# Patient Record
Sex: Female | Born: 1965 | Race: White | Hispanic: No | Smoking: Never smoker
Health system: Southern US, Community
[De-identification: ages and names within clinical notes are randomized; demographics above are authoritative.]

## PROBLEM LIST (undated history)

## (undated) DIAGNOSIS — F419 Anxiety disorder, unspecified: Secondary | ICD-10-CM

## (undated) DIAGNOSIS — G4730 Sleep apnea, unspecified: Secondary | ICD-10-CM

## (undated) HISTORY — PX: MENISCUS REPAIR: SHX5179

---

## 2007-02-08 ENCOUNTER — Ambulatory Visit: Payer: Self-pay | Admitting: Cardiology

## 2007-04-02 ENCOUNTER — Other Ambulatory Visit: Admission: RE | Admit: 2007-04-02 | Discharge: 2007-04-02 | Payer: Self-pay | Admitting: Gynecology

## 2008-04-02 ENCOUNTER — Other Ambulatory Visit: Admission: RE | Admit: 2008-04-02 | Discharge: 2008-04-02 | Payer: Self-pay | Admitting: Gynecology

## 2010-08-17 NOTE — Assessment & Plan Note (Signed)
[facility]                            CARDIOLOGY OFFICE NOTE   [name], [name]                          MRN:          [id number]  DATE:02/08/2007                            DOB:          [birth year]    I was asked by Payton Spark to evaluate Kathryn Reese, a  delightful 45 year old married white female with chest discomfort.   This happened on Monday evening a couple of days ago while she was  sitting on the couch watching a movie. She felt a squeezing sensation.  She has been a little short of breath with exertion over the last 2 to 3  days.   She does not have chest squeezing with walking. She has been less active  as of late and has gained some weight. She is under a lot of stress in  general.   She denies any fever, chills, cough, or hemoptysis. She has had no  difficulty swallowing or reflux symptoms. She denies any melena.   PAST MEDICAL HISTORY:  She does not smoke or drink. There is no history  of hypertension, diabetes, or recreational drug use. She does not have  hyperlipidemia. There is no premature history of coronary disease in her  family.   She is not allergic to dye.   She is currently on:  1. Cyclessa birth control pill.  2. Celexa 20 mg daily.   PAST SURGICAL HISTORY:  Negative.   FAMILY HISTORY:  Negative for premature coronary disease.   SOCIAL HISTORY:  She is married and has no children. 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   REVIEW OF SYSTEMS:  Negative except for the HPI, and she does have a  history of chronic anxiety.   PHYSICAL EXAMINATION:  VITAL SIGNS:  Blood pressure 112/62, pulse 80 and  regular. Her EKG is normal. She is 5 foot 4 inches and weighs 208  pounds. Respiratory rate is 18 and unlabored.  GENERAL:  No acute distress.  SKIN:  Warm and dry.  HEENT:  Normocephalic atraumatic, PERRLA, extraocular movements intact,  sclerae clear, facial symmetry is normal.  NECK:   Supple. Carotid upstrokes are equal bilaterally without bruits.  There is no JVD. Thyroid is not enlarged. Trachea is midline.  LUNGS:  Clear. No rhonchi, wheezes, or rubs.  CARDIAC:  PMI was difficult to appreciate. She has normal S1, S2, with  no murmurs, rubs, or gallops.  ABDOMEN:  Soft with good bowel sounds.  EXTREMITIES:  No cyanosis, clubbing, or edema. Pulses are intact.  NEUROLOGIC:  Intact.   ASSESSMENT AND PLAN:  1. Non-cardiac chest pain.  2. Probable anxiety related chest discomfort and shortness of breath.  3. Deconditioning.  4. Obesity.   I have had a long talk with [name] today. Her Framingham risk score  is less than 1% of having a coronary event in the next ten years. I have  advised her that if she will walk or exercise three hours plus a week,  she will lower her risk about 20% to 25% per year of having  a vascular  event. She is determined to do so.   We will see her back on a p.r.n. basis. I called Payton Spark  about this patient.     Thomas C. Daleen Squibb, MD, [facility]  Electronically Signed    TCW/MedQ  DD: 02/08/2007  DT: 02/09/2007  Job #: [id number]   cc:   Payton Spark, [facility]

## 2014-05-23 ENCOUNTER — Other Ambulatory Visit: Payer: Self-pay | Admitting: Gynecology

## 2014-05-23 DIAGNOSIS — R928 Other abnormal and inconclusive findings on diagnostic imaging of breast: Secondary | ICD-10-CM

## 2014-06-03 ENCOUNTER — Ambulatory Visit
Admission: RE | Admit: 2014-06-03 | Discharge: 2014-06-03 | Disposition: A | Discharge status: Home / Self Care | Payer: Managed Care, Other (non HMO) | Source: Ambulatory Visit | Attending: Gynecology | Admitting: Gynecology

## 2014-06-03 ENCOUNTER — Other Ambulatory Visit: Payer: Self-pay | Admitting: Gynecology

## 2014-06-03 DIAGNOSIS — R928 Other abnormal and inconclusive findings on diagnostic imaging of breast: Secondary | ICD-10-CM

## 2016-09-13 ENCOUNTER — Other Ambulatory Visit: Payer: Self-pay | Admitting: Obstetrics and Gynecology

## 2016-09-13 DIAGNOSIS — R928 Other abnormal and inconclusive findings on diagnostic imaging of breast: Secondary | ICD-10-CM

## 2016-09-21 ENCOUNTER — Other Ambulatory Visit: Payer: Self-pay | Admitting: Obstetrics and Gynecology

## 2016-09-21 ENCOUNTER — Ambulatory Visit
Admission: RE | Admit: 2016-09-21 | Discharge: 2016-09-21 | Disposition: A | Discharge status: Home / Self Care | Payer: Managed Care, Other (non HMO) | Source: Ambulatory Visit | Attending: Obstetrics and Gynecology | Admitting: Obstetrics and Gynecology

## 2016-09-21 DIAGNOSIS — R921 Mammographic calcification found on diagnostic imaging of breast: Secondary | ICD-10-CM

## 2016-09-21 DIAGNOSIS — R928 Other abnormal and inconclusive findings on diagnostic imaging of breast: Secondary | ICD-10-CM

## 2017-03-29 ENCOUNTER — Other Ambulatory Visit: Payer: Self-pay | Admitting: Obstetrics and Gynecology

## 2017-03-29 ENCOUNTER — Ambulatory Visit
Admission: RE | Admit: 2017-03-29 | Discharge: 2017-03-29 | Disposition: A | Discharge status: Home / Self Care | Payer: Managed Care, Other (non HMO) | Source: Ambulatory Visit | Attending: Obstetrics and Gynecology | Admitting: Obstetrics and Gynecology

## 2017-03-29 ENCOUNTER — Other Ambulatory Visit: Payer: Managed Care, Other (non HMO)

## 2017-03-29 DIAGNOSIS — R921 Mammographic calcification found on diagnostic imaging of breast: Secondary | ICD-10-CM

## 2017-09-28 ENCOUNTER — Ambulatory Visit
Admission: RE | Admit: 2017-09-28 | Discharge: 2017-09-28 | Disposition: A | Discharge status: Home / Self Care | Payer: Managed Care, Other (non HMO) | Source: Ambulatory Visit | Attending: Obstetrics and Gynecology | Admitting: Obstetrics and Gynecology

## 2017-09-28 DIAGNOSIS — R921 Mammographic calcification found on diagnostic imaging of breast: Secondary | ICD-10-CM

## 2018-09-19 ENCOUNTER — Other Ambulatory Visit: Payer: Self-pay | Admitting: Obstetrics and Gynecology

## 2018-09-19 DIAGNOSIS — R921 Mammographic calcification found on diagnostic imaging of breast: Secondary | ICD-10-CM

## 2018-09-20 ENCOUNTER — Other Ambulatory Visit: Payer: Self-pay | Admitting: Specialist

## 2018-10-19 ENCOUNTER — Ambulatory Visit
Admission: RE | Admit: 2018-10-19 | Discharge: 2018-10-19 | Disposition: A | Discharge status: Home / Self Care | Payer: Managed Care, Other (non HMO) | Source: Ambulatory Visit | Attending: Obstetrics and Gynecology | Admitting: Obstetrics and Gynecology

## 2018-10-19 ENCOUNTER — Other Ambulatory Visit: Payer: Self-pay | Admitting: Obstetrics and Gynecology

## 2018-10-19 ENCOUNTER — Other Ambulatory Visit: Payer: Self-pay

## 2018-10-19 DIAGNOSIS — R921 Mammographic calcification found on diagnostic imaging of breast: Secondary | ICD-10-CM

## 2018-10-23 ENCOUNTER — Ambulatory Visit
Admission: RE | Admit: 2018-10-23 | Discharge: 2018-10-23 | Disposition: A | Discharge status: Home / Self Care | Payer: Managed Care, Other (non HMO) | Source: Ambulatory Visit | Attending: Obstetrics and Gynecology | Admitting: Obstetrics and Gynecology

## 2018-10-23 ENCOUNTER — Other Ambulatory Visit: Payer: Self-pay

## 2018-10-23 DIAGNOSIS — R921 Mammographic calcification found on diagnostic imaging of breast: Secondary | ICD-10-CM

## 2018-11-02 ENCOUNTER — Other Ambulatory Visit: Payer: Self-pay | Admitting: Surgery

## 2018-11-02 DIAGNOSIS — N6091 Unspecified benign mammary dysplasia of right breast: Secondary | ICD-10-CM

## 2018-11-15 ENCOUNTER — Other Ambulatory Visit: Payer: Self-pay | Admitting: Surgery

## 2018-11-15 DIAGNOSIS — N6091 Unspecified benign mammary dysplasia of right breast: Secondary | ICD-10-CM

## 2018-11-29 ENCOUNTER — Encounter (HOSPITAL_BASED_OUTPATIENT_CLINIC_OR_DEPARTMENT_OTHER): Payer: Self-pay

## 2018-11-29 ENCOUNTER — Other Ambulatory Visit: Payer: Self-pay

## 2018-12-03 ENCOUNTER — Other Ambulatory Visit (HOSPITAL_COMMUNITY)
Admission: RE | Admit: 2018-12-03 | Discharge: 2018-12-03 | Disposition: A | Discharge status: Home / Self Care | Payer: Managed Care, Other (non HMO) | Source: Ambulatory Visit | Attending: Surgery | Admitting: Surgery

## 2018-12-03 DIAGNOSIS — Z01812 Encounter for preprocedural laboratory examination: Secondary | ICD-10-CM | POA: Insufficient documentation

## 2018-12-03 DIAGNOSIS — Z20828 Contact with and (suspected) exposure to other viral communicable diseases: Secondary | ICD-10-CM | POA: Diagnosis not present

## 2018-12-03 LAB — SARS CORONAVIRUS 2 (TAT 6-24 HRS): SARS Coronavirus 2: NEGATIVE

## 2018-12-03 NOTE — Progress Notes (Signed)
Anesthesia consult per Dr. Woodrum, will proceed with surgery as scheduled.  

## 2018-12-03 NOTE — Progress Notes (Signed)

## 2018-12-04 HISTORY — PX: BREAST EXCISIONAL BIOPSY: SUR124

## 2018-12-05 ENCOUNTER — Other Ambulatory Visit: Payer: Self-pay

## 2018-12-05 ENCOUNTER — Ambulatory Visit
Admission: RE | Admit: 2018-12-05 | Discharge: 2018-12-05 | Disposition: A | Discharge status: Home / Self Care | Payer: Managed Care, Other (non HMO) | Source: Ambulatory Visit | Attending: Surgery | Admitting: Surgery

## 2018-12-05 DIAGNOSIS — N6091 Unspecified benign mammary dysplasia of right breast: Secondary | ICD-10-CM

## 2018-12-05 NOTE — Anesthesia Preprocedure Evaluation (Addendum)
Anesthesia Evaluation  Patient identified by MRN, date of birth, ID band Patient awake    Reviewed: Allergy & Precautions, NPO status , Patient's Chart, lab work & pertinent test results  Airway Mallampati: II  TM Distance: >3 FB Neck ROM: Full    Dental no notable dental hx. (+) Teeth Intact   Pulmonary sleep apnea ,  Does not wear   Pulmonary exam normal breath sounds clear to auscultation       Cardiovascular negative cardio ROS Normal cardiovascular exam Rhythm:Regular Rate:Normal     Neuro/Psych PSYCHIATRIC DISORDERS Depression    GI/Hepatic negative GI ROS, Neg liver ROS,   Endo/Other  negative endocrine ROS  Renal/GU negative Renal ROS     Musculoskeletal   Abdominal (+) + obese,   Peds  Hematology negative hematology ROS (+)   Anesthesia Other Findings   Reproductive/Obstetrics                            Anesthesia Physical Anesthesia Plan  ASA: III  Anesthesia Plan: General   Post-op Pain Management:    Induction: Intravenous  PONV Risk Score and Plan: 4 or greater and Treatment may vary due to age or medical condition, Ondansetron, Dexamethasone, Scopolamine patch - Pre-op and Midazolam  Airway Management Planned: LMA  Additional Equipment:   Intra-op Plan:   Post-operative Plan:   Informed Consent: I have reviewed the patients History and Physical, chart, labs and discussed the procedure including the risks, benefits and alternatives for the proposed anesthesia with the patient or authorized representative who has indicated his/her understanding and acceptance.     Dental advisory given  Plan Discussed with:   Anesthesia Plan Comments:        Anesthesia Quick Evaluation

## 2018-12-05 NOTE — H&P (Signed)
Kathryn Reese  Location: [facility] Patient #: [id number] DOB: [birth year] Divorced / Language: Cleophus Molt / Race: White Female   History of Present Illness  The patient is a 53 year old female who presents with a complaint of Breast problems. This patient is referred by Dr. 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 She is due to heavy calcific cases followed the right breast. They've increased in area so a stereotactic biopsy was performed showing the atypical ductal hyperplasia. The areas of question 1.4 cm in size. No malignancy seen on the pathology. She has no previous history of breast cancer. There is no history of breast cancer in the family. She is otherwise without complaints. She denies discharge.   Past Surgical History  Breast Biopsy  Right. Knee Surgery  Bilateral.  Diagnostic Studies History  Colonoscopy  1-5 years ago Mammogram  within last year Pap Smear  1-5 years ago  Allergies  No Known Drug Allergies  Allergies Reconciled   Medication History  valACYclovir HCl (500MG  Tablet, Oral) Active. Sertraline HCl (100MG  Tablet, Oral) Active. Medications Reconciled  Social History  Alcohol use  Occasional alcohol use. Caffeine use  Carbonated beverages, Coffee. No drug use  Tobacco use  Never smoker.  Family History  Diabetes Mellitus  Father. Hypertension  Family Members In General, Father, Mother. Melanoma  Father. Prostate Cancer  Family Members In General.  Pregnancy / Birth History Age at menarche  52 years. Age of menopause  [age] Contraceptive History  Oral contraceptives. Gravida  0 Para  0 Regular periods   Other Problems  Anxiety Disorder  Hemorrhoids  Sleep Apnea     Review of Systems  General Not Present- Appetite Loss, Chills, Fatigue, Fever, Night Sweats, Weight Gain and Weight Loss. Skin Not Present- Change in Wart/Mole, Dryness, Hives,  Jaundice, New Lesions, Non-Healing Wounds, Rash and Ulcer. HEENT Present- Seasonal Allergies and Wears glasses/contact lenses. Not Present- Earache, Hearing Loss, Hoarseness, Nose Bleed, Oral Ulcers, Ringing in the Ears, Sinus Pain, Sore Throat, Visual Disturbances and Yellow Eyes. Respiratory Present- Snoring. Not Present- Bloody sputum, Chronic Cough, Difficulty Breathing and Wheezing. Breast Not Present- Breast Mass, Breast Pain, Nipple Discharge and Skin Changes. Cardiovascular Not Present- Chest Pain, Difficulty Breathing Lying Down, Leg Cramps, Palpitations, Rapid Heart Rate, Shortness of Breath and Swelling of Extremities. Gastrointestinal Not Present- Abdominal Pain, Bloating, Bloody Stool, Change in Bowel Habits, Chronic diarrhea, Constipation, Difficulty Swallowing, Excessive gas, Gets full quickly at meals, Hemorrhoids, Indigestion, Nausea, Rectal Pain and Vomiting. Female Genitourinary Not Present- Frequency, Nocturia, Painful Urination, Pelvic Pain and Urgency. Musculoskeletal Not Present- Back Pain, Joint Pain, Joint Stiffness, Muscle Pain, Muscle Weakness and Swelling of Extremities. Neurological Not Present- Decreased Memory, Fainting, Headaches, Numbness, Seizures, Tingling, Tremor, Trouble walking and Weakness. Psychiatric Not Present- Anxiety, Bipolar, Change in Sleep Pattern, Depression, Fearful and Frequent crying. Endocrine Not Present- Cold Intolerance, Excessive Hunger, Hair Changes, Heat Intolerance, Hot flashes and New Diabetes. Hematology Not Present- Blood Thinners, Easy Bruising, Excessive bleeding, Gland problems, HIV and Persistent Infections.  Vitals  Weight: 268.5 lb Height: 64in Body Surface Area: 2.22 m Body Mass Index: 46.09 kg/m  Temp.: 98.19F  Pulse: 101 (Regular)  BP: 132/88(Sitting, Left Arm, Standard)     Physical Exam  General Mental Status-Alert. General Appearance-Consistent with stated age. Hydration-Well  hydrated. Voice-Normal.  Head and Neck Head-normocephalic, atraumatic with no lesions or palpable masses. Trachea-midline. Thyroid Gland Characteristics - normal size and consistency.  Eye Eyeball -  Bilateral-Extraocular movements intact. Sclera/Conjunctiva - Bilateral-No scleral icterus.  Chest and Lung Exam Chest and lung exam reveals -quiet, even and easy respiratory effort with no use of accessory muscles and on auscultation, normal breath sounds, no adventitious sounds and normal vocal resonance. Inspection Chest Wall - Normal. Back - normal.  Breast Breast - Left-Symmetric, Non Tender, No Biopsy scars, no Dimpling - Left, No Inflammation, No Lumpectomy scars, No Mastectomy scars, No Peau d' Orange. Breast - Right-Symmetric, Non Tender, No Biopsy scars, no Dimpling - Right, No Inflammation, No Lumpectomy scars, No Mastectomy scars, No Peau d' Orange. Breast Lump-No Palpable Breast Mass.  Cardiovascular Cardiovascular examination reveals -normal heart sounds, regular rate and rhythm with no murmurs and normal pedal pulses bilaterally.  Abdomen Inspection Inspection of the abdomen reveals - No Hernias. Skin - Scar - no surgical scars. Palpation/Percussion Palpation and Percussion of the abdomen reveal - Soft, Non Tender, No Rebound tenderness, No Rigidity (guarding) and No hepatosplenomegaly. Auscultation Auscultation of the abdomen reveals - Bowel sounds normal.  Neurologic - Did not examine.  Musculoskeletal - Did not examine.  Lymphatic Head & Neck  General Head & Neck Lymphatics: Bilateral - Description - Normal. Axillary  General Axillary Region: Bilateral - Description - Normal. Tenderness - Non Tender. Femoral & Inguinal - Did not examine.    Assessment & Plan  ATYPICAL DUCTAL HYPERPLASIA OF RIGHT BREAST (N60.91) Impression: I discussed the diagnosis with the patient and gave her a copy of the pathology results. We discussed the  reasoning for proceeding with surgery to remove this area to rule out a developing malignancy. We discussed proceeding with a radioactive seed guided right breast lumpectomy. I discussed the surgical procedure in detail. We discussed the risks which includes but is not limited to bleeding, infection, the need for further surgery malignancy is found, injury to surrounding structures, cardiopulmonary issues, postoperative recovery, etc. She understands and wishes to proceed with surgery which will be scheduled.

## 2018-12-06 ENCOUNTER — Encounter (HOSPITAL_BASED_OUTPATIENT_CLINIC_OR_DEPARTMENT_OTHER)
Admission: RE | Disposition: A | Discharge status: Home / Self Care | Payer: Self-pay | Source: Home / Self Care | Attending: Surgery

## 2018-12-06 ENCOUNTER — Other Ambulatory Visit: Payer: Self-pay

## 2018-12-06 ENCOUNTER — Ambulatory Visit
Admission: RE | Admit: 2018-12-06 | Discharge: 2018-12-06 | Disposition: A | Discharge status: Home / Self Care | Payer: Managed Care, Other (non HMO) | Source: Ambulatory Visit | Attending: Surgery | Admitting: Surgery

## 2018-12-06 ENCOUNTER — Ambulatory Visit (HOSPITAL_BASED_OUTPATIENT_CLINIC_OR_DEPARTMENT_OTHER)
Admission: RE | Admit: 2018-12-06 | Discharge: 2018-12-06 | Disposition: A | Discharge status: Home / Self Care | Payer: Managed Care, Other (non HMO) | Attending: Surgery | Admitting: Surgery

## 2018-12-06 ENCOUNTER — Ambulatory Visit (HOSPITAL_BASED_OUTPATIENT_CLINIC_OR_DEPARTMENT_OTHER): Payer: Managed Care, Other (non HMO) | Admitting: Certified Registered"

## 2018-12-06 ENCOUNTER — Encounter (HOSPITAL_BASED_OUTPATIENT_CLINIC_OR_DEPARTMENT_OTHER): Payer: Self-pay | Admitting: *Deleted

## 2018-12-06 DIAGNOSIS — F419 Anxiety disorder, unspecified: Secondary | ICD-10-CM | POA: Diagnosis not present

## 2018-12-06 DIAGNOSIS — G4730 Sleep apnea, unspecified: Secondary | ICD-10-CM | POA: Insufficient documentation

## 2018-12-06 DIAGNOSIS — N6091 Unspecified benign mammary dysplasia of right breast: Secondary | ICD-10-CM | POA: Insufficient documentation

## 2018-12-06 DIAGNOSIS — F329 Major depressive disorder, single episode, unspecified: Secondary | ICD-10-CM | POA: Diagnosis not present

## 2018-12-06 HISTORY — DX: Sleep apnea, unspecified: G47.30

## 2018-12-06 HISTORY — DX: Anxiety disorder, unspecified: F41.9

## 2018-12-06 HISTORY — PX: BREAST LUMPECTOMY WITH RADIOACTIVE SEED LOCALIZATION: SHX6424

## 2018-12-06 SURGERY — BREAST LUMPECTOMY WITH RADIOACTIVE SEED LOCALIZATION
Anesthesia: General | Site: Breast | Laterality: Right

## 2018-12-06 MED ORDER — FENTANYL CITRATE (PF) 100 MCG/2ML IJ SOLN
INTRAMUSCULAR | Status: AC
  Filled 2018-12-06: qty 2

## 2018-12-06 MED ORDER — SCOPOLAMINE 1 MG/3DAYS TD PT72
MEDICATED_PATCH | TRANSDERMAL | Status: AC
  Filled 2018-12-06: qty 1

## 2018-12-06 MED ORDER — CHLORHEXIDINE GLUCONATE CLOTH 2 % EX PADS: 6.0000 | MEDICATED_PAD | Freq: Once | CUTANEOUS | Status: DC

## 2018-12-06 MED ORDER — ONDANSETRON HCL 4 MG/2ML IJ SOLN
INTRAMUSCULAR | Status: AC
  Filled 2018-12-06: qty 2

## 2018-12-06 MED ORDER — CEFAZOLIN SODIUM-DEXTROSE 2-3 GM-%(50ML) IV SOLR
INTRAVENOUS | Status: DC | PRN
  Administered 2018-12-06: 3 g via INTRAVENOUS

## 2018-12-06 MED ORDER — DEXAMETHASONE SODIUM PHOSPHATE 10 MG/ML IJ SOLN
INTRAMUSCULAR | Status: AC
  Filled 2018-12-06: qty 1

## 2018-12-06 MED ORDER — CELECOXIB 200 MG PO CAPS
ORAL_CAPSULE | ORAL | Status: AC
  Filled 2018-12-06: qty 1

## 2018-12-06 MED ORDER — BUPIVACAINE-EPINEPHRINE 0.5% -1:200000 IJ SOLN
INTRAMUSCULAR | Status: DC | PRN
  Administered 2018-12-06: 10 mL

## 2018-12-06 MED ORDER — LACTATED RINGERS IV SOLN
INTRAVENOUS | Status: DC
  Administered 2018-12-06: 07:00:00 via INTRAVENOUS

## 2018-12-06 MED ORDER — TRAMADOL HCL 50 MG PO TABS: 50.0000 mg | ORAL_TABLET | Freq: Four times a day (QID) | ORAL | 0 refills | Status: DC | PRN

## 2018-12-06 MED ORDER — MIDAZOLAM HCL 2 MG/2ML IJ SOLN
INTRAMUSCULAR | Status: DC | PRN
  Administered 2018-12-06: 2 mg via INTRAVENOUS

## 2018-12-06 MED ORDER — HYDROCODONE-ACETAMINOPHEN 7.5-325 MG PO TABS: 1.0000 | ORAL_TABLET | Freq: Once | ORAL | Status: DC | PRN

## 2018-12-06 MED ORDER — ACETAMINOPHEN 500 MG PO TABS
ORAL_TABLET | ORAL | Status: AC
  Filled 2018-12-06: qty 2

## 2018-12-06 MED ORDER — HYDROMORPHONE HCL 1 MG/ML IJ SOLN: 0.2500 mg | INTRAMUSCULAR | Status: DC | PRN

## 2018-12-06 MED ORDER — CEFAZOLIN SODIUM-DEXTROSE 2-4 GM/100ML-% IV SOLN
INTRAVENOUS | Status: AC
  Filled 2018-12-06: qty 200

## 2018-12-06 MED ORDER — SCOPOLAMINE 1 MG/3DAYS TD PT72: 1.0000 | MEDICATED_PATCH | TRANSDERMAL | Status: DC

## 2018-12-06 MED ORDER — CELECOXIB 200 MG PO CAPS
200.0000 mg | ORAL_CAPSULE | ORAL | Status: AC
  Administered 2018-12-06: 200 mg via ORAL

## 2018-12-06 MED ORDER — KETOROLAC TROMETHAMINE 30 MG/ML IJ SOLN: 30.0000 mg | Freq: Once | INTRAMUSCULAR | Status: DC | PRN

## 2018-12-06 MED ORDER — LIDOCAINE HCL (CARDIAC) PF 100 MG/5ML IV SOSY
PREFILLED_SYRINGE | INTRAVENOUS | Status: DC | PRN
  Administered 2018-12-06: 100 mg via INTRAVENOUS

## 2018-12-06 MED ORDER — CEFAZOLIN SODIUM-DEXTROSE 2-4 GM/100ML-% IV SOLN: 2.0000 g | INTRAVENOUS | Status: DC

## 2018-12-06 MED ORDER — ACETAMINOPHEN 500 MG PO TABS
1000.0000 mg | ORAL_TABLET | ORAL | Status: AC
  Administered 2018-12-06: 1000 mg via ORAL

## 2018-12-06 MED ORDER — GABAPENTIN 300 MG PO CAPS
300.0000 mg | ORAL_CAPSULE | ORAL | Status: AC
  Administered 2018-12-06: 300 mg via ORAL

## 2018-12-06 MED ORDER — FENTANYL CITRATE (PF) 100 MCG/2ML IJ SOLN
INTRAMUSCULAR | Status: DC | PRN
  Administered 2018-12-06 (×2): 50 ug via INTRAVENOUS

## 2018-12-06 MED ORDER — ACETAMINOPHEN 500 MG PO TABS: 1000.0000 mg | ORAL_TABLET | Freq: Once | ORAL | Status: DC

## 2018-12-06 MED ORDER — DEXAMETHASONE SODIUM PHOSPHATE 10 MG/ML IJ SOLN
INTRAMUSCULAR | Status: DC | PRN
  Administered 2018-12-06: 10 mg via INTRAVENOUS

## 2018-12-06 MED ORDER — SCOPOLAMINE 1 MG/3DAYS TD PT72
1.0000 | MEDICATED_PATCH | Freq: Once | TRANSDERMAL | Status: DC
  Administered 2018-12-06: 1.5 mg via TRANSDERMAL

## 2018-12-06 MED ORDER — ONDANSETRON HCL 4 MG/2ML IJ SOLN
INTRAMUSCULAR | Status: DC | PRN
  Administered 2018-12-06 (×2): 4 mg via INTRAVENOUS

## 2018-12-06 MED ORDER — PROPOFOL 10 MG/ML IV BOLUS
INTRAVENOUS | Status: AC
  Filled 2018-12-06: qty 40

## 2018-12-06 MED ORDER — DEXTROSE 5 % IV SOLN: 3.0000 g | Freq: Once | INTRAVENOUS | Status: DC

## 2018-12-06 MED ORDER — BUPIVACAINE-EPINEPHRINE (PF) 0.5% -1:200000 IJ SOLN
INTRAMUSCULAR | Status: AC
  Filled 2018-12-06: qty 30

## 2018-12-06 MED ORDER — LIDOCAINE 2% (20 MG/ML) 5 ML SYRINGE
INTRAMUSCULAR | Status: AC
  Filled 2018-12-06: qty 5

## 2018-12-06 MED ORDER — ONDANSETRON HCL 4 MG/2ML IJ SOLN: 4.0000 mg | Freq: Once | INTRAMUSCULAR | Status: DC | PRN

## 2018-12-06 MED ORDER — MEPERIDINE HCL 25 MG/ML IJ SOLN: 6.2500 mg | INTRAMUSCULAR | Status: DC | PRN

## 2018-12-06 MED ORDER — GABAPENTIN 300 MG PO CAPS
ORAL_CAPSULE | ORAL | Status: AC
  Filled 2018-12-06: qty 1

## 2018-12-06 MED ORDER — MIDAZOLAM HCL 2 MG/2ML IJ SOLN
INTRAMUSCULAR | Status: AC
  Filled 2018-12-06: qty 2

## 2018-12-06 MED ORDER — PROPOFOL 10 MG/ML IV BOLUS
INTRAVENOUS | Status: DC | PRN
  Administered 2018-12-06: 180 mg via INTRAVENOUS

## 2018-12-06 SURGICAL SUPPLY — 49 items
APPLIER CLIP 9.375 MED OPEN (MISCELLANEOUS)
BINDER BREAST XXLRG (GAUZE/BANDAGES/DRESSINGS) ×2 IMPLANT
BLADE HEX COATED 2.75 (ELECTRODE) ×2 IMPLANT
BLADE SURG 15 STRL LF DISP TIS (BLADE) ×1 IMPLANT
BLADE SURG 15 STRL SS (BLADE) ×1
CANISTER SUC SOCK COL 7IN (MISCELLANEOUS) IMPLANT
CANISTER SUCT 1200ML W/VALVE (MISCELLANEOUS) IMPLANT
CHLORAPREP W/TINT 26 (MISCELLANEOUS) ×2 IMPLANT
CLIP APPLIE 9.375 MED OPEN (MISCELLANEOUS) IMPLANT
CLIP VESOCCLUDE SM WIDE 6/CT (CLIP) ×2 IMPLANT
COVER BACK TABLE REUSABLE LG (DRAPES) ×2 IMPLANT
COVER MAYO STAND REUSABLE (DRAPES) ×2 IMPLANT
COVER PROBE W GEL 5X96 (DRAPES) ×2 IMPLANT
COVER WAND RF STERILE (DRAPES) IMPLANT
DECANTER SPIKE VIAL GLASS SM (MISCELLANEOUS) IMPLANT
DERMABOND ADVANCED (GAUZE/BANDAGES/DRESSINGS) ×1
DERMABOND ADVANCED .7 DNX12 (GAUZE/BANDAGES/DRESSINGS) ×1 IMPLANT
DRAPE LAPAROSCOPIC ABDOMINAL (DRAPES) ×2 IMPLANT
DRAPE UTILITY XL STRL (DRAPES) ×2 IMPLANT
ELECT REM PT RETURN 9FT ADLT (ELECTROSURGICAL) ×2
ELECTRODE REM PT RTRN 9FT ADLT (ELECTROSURGICAL) ×1 IMPLANT
GAUZE SPONGE 4X4 12PLY STRL LF (GAUZE/BANDAGES/DRESSINGS) IMPLANT
GLOVE BIOGEL PI IND STRL 7.0 (GLOVE) ×1 IMPLANT
GLOVE BIOGEL PI IND STRL 7.5 (GLOVE) ×3 IMPLANT
GLOVE BIOGEL PI INDICATOR 7.0 (GLOVE) ×1
GLOVE BIOGEL PI INDICATOR 7.5 (GLOVE) ×3
GLOVE SURG SIGNA 7.5 PF LTX (GLOVE) IMPLANT
GLOVE SURG SS PI 7.0 STRL IVOR (GLOVE) ×2 IMPLANT
GLOVE SURG SS PI 7.5 STRL IVOR (GLOVE) ×2 IMPLANT
GOWN STRL REUS W/ TWL LRG LVL3 (GOWN DISPOSABLE) ×1 IMPLANT
GOWN STRL REUS W/ TWL XL LVL3 (GOWN DISPOSABLE) ×2 IMPLANT
GOWN STRL REUS W/TWL LRG LVL3 (GOWN DISPOSABLE) ×1
GOWN STRL REUS W/TWL XL LVL3 (GOWN DISPOSABLE) ×2
KIT MARKER MARGIN INK (KITS) ×2 IMPLANT
NEEDLE HYPO 25X1 1.5 SAFETY (NEEDLE) ×2 IMPLANT
NS IRRIG 1000ML POUR BTL (IV SOLUTION) ×2 IMPLANT
PACK BASIN DAY SURGERY FS (CUSTOM PROCEDURE TRAY) ×2 IMPLANT
PENCIL BUTTON HOLSTER BLD 10FT (ELECTRODE) ×2 IMPLANT
SLEEVE SCD COMPRESS KNEE MED (MISCELLANEOUS) ×2 IMPLANT
SPONGE LAP 4X18 RFD (DISPOSABLE) ×2 IMPLANT
SUT MNCRL AB 4-0 PS2 18 (SUTURE) ×2 IMPLANT
SUT SILK 2 0 SH (SUTURE) IMPLANT
SUT VIC AB 3-0 SH 27 (SUTURE) ×1
SUT VIC AB 3-0 SH 27X BRD (SUTURE) ×1 IMPLANT
SYR CONTROL 10ML LL (SYRINGE) ×2 IMPLANT
TOWEL GREEN STERILE FF (TOWEL DISPOSABLE) ×2 IMPLANT
TRAY FAXITRON CT DISP (TRAY / TRAY PROCEDURE) ×2 IMPLANT
TUBE CONNECTING 20X1/4 (TUBING) IMPLANT
YANKAUER SUCT BULB TIP NO VENT (SUCTIONS) IMPLANT

## 2018-12-06 NOTE — Transfer of Care (Signed)
Immediate Anesthesia Transfer of Care Note  Patient: Kathryn Reese  Procedure(s) Performed: RIGHT BREAST LUMPECTOMY WITH RADIOACTIVE SEED LOCALIZATION (Right Breast)  Patient Location: PACU  Anesthesia Type:General  Level of Consciousness: awake, alert  and oriented  Airway & Oxygen Therapy: Patient Spontanous Breathing and Patient connected to face mask oxygen  Post-op Assessment: Report given to RN and Post -op Vital signs reviewed and stable  Post vital signs: Reviewed and stable  Last Vitals:  Vitals Value Taken Time  BP    Temp    Pulse 78 12/06/18 0805  Resp 16 12/06/18 0805  SpO2 100 % 12/06/18 0805  Vitals shown include unvalidated device data.  Last Pain:  Vitals:   12/06/18 0629  TempSrc: Oral  PainSc: 0-No pain      Patients Stated Pain Goal: 3 ([id number])  Complications: No apparent anesthesia complications

## 2018-12-06 NOTE — Anesthesia Postprocedure Evaluation (Signed)
Anesthesia Post Note  Patient: Kathryn Reese  Procedure(s) Performed: RIGHT BREAST LUMPECTOMY WITH RADIOACTIVE SEED LOCALIZATION (Right Breast)     Patient location during evaluation: PACU Anesthesia Type: General Level of consciousness: awake and alert Pain management: pain level controlled Vital Signs Assessment: post-procedure vital signs reviewed and stable Respiratory status: spontaneous breathing, nonlabored ventilation, respiratory function stable and patient connected to nasal cannula oxygen Cardiovascular status: blood pressure returned to baseline and stable Postop Assessment: no apparent nausea or vomiting Anesthetic complications: no    Last Vitals:  Vitals:   12/06/18 0834 12/06/18 0840  BP:  118/60  Pulse: 80 78  Resp: 17 16  Temp:    SpO2: 97% 96%    Last Pain:  Vitals:   12/06/18 0840  TempSrc:   PainSc: 2                  Barnet Glasgow

## 2018-12-06 NOTE — Discharge Instructions (Signed)
[place] Office Phone Number [phone]  BREAST BIOPSY/ PARTIAL MASTECTOMY: POST OP INSTRUCTIONS  Always review your discharge instruction sheet given to you by the facility where your surgery was performed.  IF YOU HAVE DISABILITY OR FAMILY LEAVE FORMS, YOU MUST BRING THEM TO THE OFFICE FOR PROCESSING.  DO NOT GIVE THEM TO YOUR DOCTOR.  1. A prescription for pain medication may be given to you upon discharge.  Take your pain medication as prescribed, if needed.  If narcotic pain medicine is not needed, then you may take acetaminophen (Tylenol) or ibuprofen (Advil) as needed. 2. Take your usually prescribed medications unless otherwise directed 3. If you need a refill on your pain medication, please contact your pharmacy.  They will contact our office to request authorization.  Prescriptions will not be filled after 5pm or on week-ends. 4. You should eat very light the first 24 hours after surgery, such as soup, crackers, pudding, etc.  Resume your normal diet the day after surgery. 5. Most patients will experience some swelling and bruising in the breast.  Ice packs and a good support bra will help.  Swelling and bruising can take several days to resolve.  6. It is common to experience some constipation if taking pain medication after surgery.  Increasing fluid intake and taking a stool softener will usually help or prevent this problem from occurring.  A mild laxative (Milk of Magnesia or Miralax) should be taken according to package directions if there are no bowel movements after 48 hours. 7. Unless discharge instructions indicate otherwise, you may remove your bandages 24-48 hours after surgery, and you may shower at that time.  You may have steri-strips (small skin tapes) in place directly over the incision.  These strips should be left on the skin for 7-10 days.  If your surgeon used skin glue on the incision, you may shower in 24 hours.  The glue will flake off over the  next 2-3 weeks.  Any sutures or staples will be removed at the office during your follow-up visit. 8. ACTIVITIES:  You may resume regular daily activities (gradually increasing) beginning the next day.  Wearing a good support bra or sports bra minimizes pain and swelling.  You may have sexual intercourse when it is comfortable. a. You may drive when you no longer are taking prescription pain medication, you can comfortably wear a seatbelt, and you can safely maneuver your car and apply brakes. b. RETURN TO WORK:  ______________________________________________________________________________________ 9. You should see your doctor in the office for a follow-up appointment approximately two weeks after your surgery.  Your doctors nurse will typically make your follow-up appointment when she calls you with your pathology report.  Expect your pathology report 2-3 business days after your surgery.  You may call to check if you do not hear from [place] after three days. 10. OTHER INSTRUCTIONS: _Okay to shower starting tomorrow  11. Ice pack, Tylenol, and ibuprofen also for pain  12. No vigorous activity or lifting for 1 week ______________________________________________________________________________________________ _____________________________________________________________________________________________________________________________________ _____________________________________________________________________________________________________________________________________ _____________________________________________________________________________________________________________________________________  WHEN TO CALL YOUR DOCTOR: 1. Fever over 101.0 2. Nausea and/or vomiting. 3. Extreme swelling or bruising. 4. Continued bleeding from incision. 5. Increased pain, redness, or drainage from the incision.  The clinic staff is available to answer your questions during regular business hours.  Please dont  hesitate to call and ask to speak to one of the nurses for clinical concerns.  If you have a medical emergency, go to the nearest emergency room  or call 911.  A surgeon from [facility] is always on call at the hospital.  For further questions, please visit [email or web address]     Post Anesthesia Home Care Instructions  Activity: Get plenty of rest for the remainder of the day. A responsible individual must stay with you for 24 hours following the procedure.  For the next 24 hours, DO NOT: -Drive a car -Paediatric nurse -Drink alcoholic beverages -Take any medication unless instructed by your physician -Make any legal decisions or sign important papers.  Meals: Start with liquid foods such as gelatin or soup. Progress to regular foods as tolerated. Avoid greasy, spicy, heavy foods. If nausea and/or vomiting occur, drink only clear liquids until the nausea and/or vomiting subsides. Call your physician if vomiting continues.  Special Instructions/Symptoms: Your throat may feel dry or sore from the anesthesia or the breathing tube placed in your throat during surgery. If this causes discomfort, gargle with warm salt water. The discomfort should disappear within 24 hours.  If you had a scopolamine patch placed behind your ear for the management of post- operative nausea and/or vomiting:  1. The medication in the patch is effective for 72 hours, after which it should be removed.  Wrap patch in a tissue and discard in the trash. Wash hands thoroughly with soap and water. 2. You may remove the patch earlier than 72 hours if you experience unpleasant side effects which may include dry mouth, dizziness or visual disturbances. 3. Avoid touching the patch. Wash your hands with soap and water after contact with the patch.    Call your surgeon if you experience:   1.  Fever over 101.0. 2.  Inability to urinate. 3.  Nausea and/or vomiting. 4.  Extreme swelling or bruising at  the surgical site. 5.  Continued bleeding from the incision. 6.  Increased pain, redness or drainage from the incision. 7.  Problems related to your pain medication. 8.  Any problems and/or concerns

## 2018-12-06 NOTE — Interval H&P Note (Signed)
History and Physical Interval Note:no change in H and P  12/06/2018 6:56 AM  Kathryn Reese  has presented today for surgery, with the diagnosis of right breast atypical ductal hyperplasia.  The various methods of treatment have been discussed with the patient and family. After consideration of risks, benefits and other options for treatment, the patient has consented to  Procedure(s): RIGHT BREAST LUMPECTOMY WITH RADIOACTIVE SEED LOCALIZATION (Right) as a surgical intervention.  The patient's history has been reviewed, patient examined, no change in status, stable for surgery.  I have reviewed the patient's chart and labs.  Questions were answered to the patient's satisfaction.     Coralie Keens

## 2018-12-06 NOTE — Anesthesia Procedure Notes (Signed)
Procedure Name: LMA Insertion Performed by: Patricia Fargo M, CRNA Pre-anesthesia Checklist: Patient identified, Emergency Drugs available, Suction available, Patient being monitored and Timeout performed Patient Re-evaluated:Patient Re-evaluated prior to induction Oxygen Delivery Method: Circle system utilized Preoxygenation: Pre-oxygenation with 100% oxygen Induction Type: IV induction LMA: LMA inserted LMA Size: 4.0 Tube type: Oral Number of attempts: 1 Placement Confirmation: positive ETCO2,  CO2 detector and breath sounds checked- equal and bilateral Tube secured with: Tape Dental Injury: Teeth and Oropharynx as per pre-operative assessment        

## 2018-12-06 NOTE — Op Note (Signed)
RIGHT BREAST LUMPECTOMY WITH RADIOACTIVE SEED LOCALIZATION  Procedure Note  Kathryn Reese 12/06/2018   Pre-op Diagnosis: Right breast atypical ductal hyperplasia     Post-op Diagnosis: same  Procedure(s): RIGHT BREAST LUMPECTOMY WITH RADIOACTIVE SEED LOCALIZATION  Surgeon(s): Coralie Keens, MD  Anesthesia: General  Staff:  Circulator: Maurene Capes, RN Scrub Person: Clement Husbands, CST  Estimated Blood Loss: Minimal               Specimens: sent to path  Indications: This is a 53 year old female who was found to have an abnormality on the right breast on screening mammography.  She underwent a stereotactic biopsy showing atypical ductal hyperplasia.  The decision was made to proceed to the operating room for lumpectomy  Procedure: The patient was brought to the operating room and identifies the correct patient.  She was placed upon the operating room table and general anesthesia was induced.  Her right breast was prepped and draped in usual sterile fashion.  The seed was located with the aid of the neoprobe in the outer quadrant of the right breast.  I anesthetized the skin around the lateral edge of the areola and made an incision with a scalpel.  I then dissected down to the breast tissue with electrocautery.  I then dissected laterally toward the radioactive seed with a the neoprobe.  I then performed a lumpectomy around the seed with the electrocautery and aid of the neoprobe.  Once the specimen was removed I again confirmed that the seed was in the specimen with the neoprobe.  I marked the margins with pain.  An x-ray was performed confirming that the seed and previous biopsy clip were in the specimen.  It was then sent to pathology for evaluation.  I placed a surgical clip in the biopsy cavity.  Hemostasis appeared to be achieved.  I anesthetized the incision further with Marcaine.  I then closed the subcutaneous tissue with interrupted 3-0 Vicryl sutures and closed the skin  with a running 4-0 Monocryl.  Dermabond was then applied.  The patient tolerated the procedure well.  All the counts were correct at the end of the procedure.  The patient was then extubated in the operating room and taken in a stable condition to the recovery room.          Coralie Keens   Date: 12/06/2018  Time: 7:59 AM

## 2018-12-07 ENCOUNTER — Encounter (HOSPITAL_BASED_OUTPATIENT_CLINIC_OR_DEPARTMENT_OTHER): Payer: Self-pay | Admitting: Surgery

## 2019-10-10 ENCOUNTER — Other Ambulatory Visit: Payer: Self-pay | Admitting: Obstetrics and Gynecology

## 2019-10-10 DIAGNOSIS — Z1231 Encounter for screening mammogram for malignant neoplasm of breast: Secondary | ICD-10-CM

## 2019-10-23 ENCOUNTER — Ambulatory Visit
Admission: RE | Admit: 2019-10-23 | Discharge: 2019-10-23 | Disposition: A | Discharge status: Home / Self Care | Payer: Managed Care, Other (non HMO) | Source: Ambulatory Visit | Attending: Obstetrics and Gynecology | Admitting: Obstetrics and Gynecology

## 2019-10-23 ENCOUNTER — Other Ambulatory Visit: Payer: Self-pay

## 2019-10-23 DIAGNOSIS — Z1231 Encounter for screening mammogram for malignant neoplasm of breast: Secondary | ICD-10-CM

## 2020-01-28 NOTE — H&P (Addendum)
Kathryn Reese is an 54 y.o. female presents for evaluation and mngt of irregular postmenopausal bleeding.  Menstrual History:  No LMP recorded. Patient is postmenopausal.    Past Medical History:  Diagnosis Date  . Anxiety   . Sleep apnea    does not use cpap    Past Surgical History:  Procedure Laterality Date  . BREAST EXCISIONAL BIOPSY Right 12/2018  . BREAST LUMPECTOMY WITH RADIOACTIVE SEED LOCALIZATION Right 12/06/2018   Procedure: RIGHT BREAST LUMPECTOMY WITH RADIOACTIVE SEED LOCALIZATION;  Surgeon: Coralie Keens, MD;  Location: [place];  Service: General;  Laterality: Right;  . MENISCUS REPAIR     x2    No family history on file.  Social History:  reports that she has never smoked. She has never used smokeless tobacco. She reports current alcohol use. She reports that she does not use drugs.  Allergies: No Known Allergies  Meds:  Zoloft, metronidazole, valtrex, cyclobenzaprine  Review of Systems  \ Physical Exam  AFVSS Gen - NAD ABd - soft, NT CV - RRR Lungs - clear bilaterally PV - uterus mobile and NT  PV [place]:  2 small intramural and subserosal fibroids.  24mm intracavitary mass/polyp.  No free fluid  Assessment/Plan:  Irregular bleeding Hysteroscopy, D&C, myosure resection of endometrial mass R/b/a discussed, questions answered, informed consent obtained  Marylynn Pearson 01/28/2020, 2:20 PM

## 2020-02-04 ENCOUNTER — Other Ambulatory Visit (HOSPITAL_COMMUNITY)
Admission: RE | Admit: 2020-02-04 | Discharge: 2020-02-04 | Disposition: A | Discharge status: Home / Self Care | Payer: Managed Care, Other (non HMO) | Source: Ambulatory Visit | Attending: Obstetrics and Gynecology | Admitting: Obstetrics and Gynecology

## 2020-02-04 DIAGNOSIS — Z20822 Contact with and (suspected) exposure to covid-19: Secondary | ICD-10-CM | POA: Insufficient documentation

## 2020-02-04 DIAGNOSIS — Z01812 Encounter for preprocedural laboratory examination: Secondary | ICD-10-CM | POA: Diagnosis not present

## 2020-02-04 LAB — SARS CORONAVIRUS 2 (TAT 6-24 HRS): SARS Coronavirus 2: NEGATIVE

## 2020-02-05 ENCOUNTER — Encounter (HOSPITAL_COMMUNITY): Payer: Self-pay | Admitting: Obstetrics and Gynecology

## 2020-02-05 ENCOUNTER — Other Ambulatory Visit: Payer: Self-pay

## 2020-02-05 NOTE — Progress Notes (Signed)
Same Day Work-Up Phone Call:  PCP - Suzanna Obey, MD Cardiologist - Denies  PPM/ICD - Denies  Chest x-ray - N/A EKG - N/A Stress Test - Denies ECHO - Denies Cardiac Cath - Denies  Sleep Study - Yes, positive for OSA CPAP - Sometimes  Patient denies being diabetic.  Blood Thinner Instructions: N/A Aspirin Instructions: N/A  ERAS Protcol - Yes PRE-SURGERY Ensure or G2- None ordered  COVID TEST- 02/04/20, NEGATIVE   Anesthesia review: No  -----------------------------------------------------------------------  Patient Instructions:   Your procedure is scheduled on Thursday, November 4.  Report to [facility] Main Entrance "A" at 05:30 A.M., and check in at the Admitting office.  Call this number if you have problems the morning of surgery:  [phone]    Remember:  Do not eat after midnight the night before your surgery.  You may drink clear liquids until 04:30 AM the morning of your surgery.   Clear liquids allowed are: Water, Non-Citrus Juices (without pulp), Carbonated Beverages, Clear Tea, Black Coffee Only, and Gatorade.    Take these medicines the morning of surgery with A SIP OF WATER: cetirizine (ZYRTEC) sertraline (ZOLOFT)   As of today, STOP taking any Aspirin (unless otherwise instructed by your surgeon) Aleve, Naproxen, Ibuprofen, Motrin, Advil, Goody's, BC's, all herbal medications, fish oil, and all vitamins.  Day of Surgery: Shower Wear Clean/Comfortable clothing the morning of surgery Do not apply any deodorants/lotions.               Do not wear jewelry, make up, or nail polish Remember to brush your teeth WITH YOUR REGULAR TOOTHPASTE.             Do not shave 48 hours prior to surgery.              Do not bring valuables to the hospital.             [facility] is not responsible for any belongings or valuables.    Do NOT Smoke (Tobacco/Vaping) or drink Alcohol 24 hours prior to your procedure.  If you use a CPAP at night, you may bring  all equipment for your overnight stay.   Contacts, glasses, dentures or bridgework may not be worn into surgery.      Patients discharged the day of surgery will not be allowed to drive home, and someone needs to stay with them for 24 hours.

## 2020-02-05 NOTE — Anesthesia Preprocedure Evaluation (Addendum)
Anesthesia Evaluation  Patient identified by MRN, date of birth, ID band Patient awake    Reviewed: Allergy & Precautions, NPO status , Patient's Chart, lab work & pertinent test results  Airway Mallampati: III  TM Distance: >3 FB Neck ROM: Full    Dental no notable dental hx. (+) Dental Advisory Given, Teeth Intact   Pulmonary sleep apnea ,  Does not wear   Pulmonary exam normal breath sounds clear to auscultation       Cardiovascular negative cardio ROS Normal cardiovascular exam Rhythm:Regular Rate:Normal     Neuro/Psych PSYCHIATRIC DISORDERS Anxiety Depression    GI/Hepatic negative GI ROS, Neg liver ROS,   Endo/Other  negative endocrine ROS  Renal/GU negative Renal ROS     Musculoskeletal   Abdominal (+) + obese,   Peds  Hematology negative hematology ROS (+)   Anesthesia Other Findings   Reproductive/Obstetrics                            Anesthesia Physical  Anesthesia Plan  ASA: III  Anesthesia Plan: General   Post-op Pain Management:    Induction: Intravenous  PONV Risk Score and Plan: 4 or greater and Treatment may vary due to age or medical condition, Ondansetron, Dexamethasone, Scopolamine patch - Pre-op and Midazolam  Airway Management Planned: LMA  Additional Equipment: None  Intra-op Plan:   Post-operative Plan: Extubation in OR  Informed Consent: I have reviewed the patients History and Physical, chart, labs and discussed the procedure including the risks, benefits and alternatives for the proposed anesthesia with the patient or authorized representative who has indicated his/her understanding and acceptance.     Dental advisory given  Plan Discussed with: CRNA  Anesthesia Plan Comments:        Anesthesia Quick Evaluation

## 2020-02-06 ENCOUNTER — Encounter (HOSPITAL_COMMUNITY): Payer: Self-pay | Admitting: Obstetrics and Gynecology

## 2020-02-06 ENCOUNTER — Encounter (HOSPITAL_COMMUNITY)
Admission: RE | Disposition: A | Discharge status: Home / Self Care | Payer: Self-pay | Source: Home / Self Care | Attending: Obstetrics and Gynecology

## 2020-02-06 ENCOUNTER — Ambulatory Visit (HOSPITAL_COMMUNITY): Payer: Managed Care, Other (non HMO) | Admitting: Anesthesiology

## 2020-02-06 ENCOUNTER — Ambulatory Visit (HOSPITAL_COMMUNITY)
Admission: RE | Admit: 2020-02-06 | Discharge: 2020-02-06 | Disposition: A | Discharge status: Home / Self Care | Payer: Managed Care, Other (non HMO) | Attending: Obstetrics and Gynecology | Admitting: Obstetrics and Gynecology

## 2020-02-06 ENCOUNTER — Other Ambulatory Visit: Payer: Self-pay

## 2020-02-06 DIAGNOSIS — N950 Postmenopausal bleeding: Secondary | ICD-10-CM | POA: Diagnosis not present

## 2020-02-06 DIAGNOSIS — C541 Malignant neoplasm of endometrium: Secondary | ICD-10-CM | POA: Diagnosis not present

## 2020-02-06 DIAGNOSIS — N840 Polyp of corpus uteri: Secondary | ICD-10-CM | POA: Diagnosis not present

## 2020-02-06 HISTORY — PX: DILATATION & CURETTAGE/HYSTEROSCOPY WITH MYOSURE: SHX6511

## 2020-02-06 LAB — CBC
HCT: 42.5 % (ref 36.0–46.0)
Hemoglobin: 13.3 g/dL (ref 12.0–15.0)
MCH: 30.2 pg (ref 26.0–34.0)
MCHC: 31.3 g/dL (ref 30.0–36.0)
MCV: 96.4 fL (ref 80.0–100.0)
Platelets: 316 10*3/uL (ref 150–400)
RBC: 4.41 MIL/uL (ref 3.87–5.11)
RDW: 12.4 % (ref 11.5–15.5)
WBC: 6.6 10*3/uL (ref 4.0–10.5)
nRBC: 0 % (ref 0.0–0.2)

## 2020-02-06 LAB — TYPE AND SCREEN
ABO/RH(D): O POS
Antibody Screen: NEGATIVE

## 2020-02-06 SURGERY — DILATATION & CURETTAGE/HYSTEROSCOPY WITH MYOSURE
Anesthesia: General | Site: Uterus

## 2020-02-06 MED ORDER — PROMETHAZINE HCL 25 MG/ML IJ SOLN
INTRAMUSCULAR | Status: AC
  Filled 2020-02-06: qty 1

## 2020-02-06 MED ORDER — PROPOFOL 10 MG/ML IV BOLUS
INTRAVENOUS | Status: AC
  Filled 2020-02-06: qty 40

## 2020-02-06 MED ORDER — DEXAMETHASONE SODIUM PHOSPHATE 10 MG/ML IJ SOLN
INTRAMUSCULAR | Status: AC
  Filled 2020-02-06: qty 1

## 2020-02-06 MED ORDER — POVIDONE-IODINE 10 % EX SWAB
2.0000 "application " | Freq: Once | CUTANEOUS | Status: AC
  Administered 2020-02-06: 2 via TOPICAL

## 2020-02-06 MED ORDER — LACTATED RINGERS IV SOLN: INTRAVENOUS | Status: DC | PRN

## 2020-02-06 MED ORDER — FENTANYL CITRATE (PF) 250 MCG/5ML IJ SOLN
INTRAMUSCULAR | Status: AC
  Filled 2020-02-06: qty 5

## 2020-02-06 MED ORDER — LIDOCAINE 2% (20 MG/ML) 5 ML SYRINGE
INTRAMUSCULAR | Status: AC
  Filled 2020-02-06: qty 5

## 2020-02-06 MED ORDER — MIDAZOLAM HCL 2 MG/2ML IJ SOLN
INTRAMUSCULAR | Status: AC
  Filled 2020-02-06: qty 2

## 2020-02-06 MED ORDER — LIDOCAINE HCL (CARDIAC) PF 100 MG/5ML IV SOSY
PREFILLED_SYRINGE | INTRAVENOUS | Status: DC | PRN
  Administered 2020-02-06: 100 mg via INTRATRACHEAL

## 2020-02-06 MED ORDER — LACTATED RINGERS IV SOLN: INTRAVENOUS | Status: DC

## 2020-02-06 MED ORDER — FENTANYL CITRATE (PF) 250 MCG/5ML IJ SOLN
INTRAMUSCULAR | Status: DC | PRN
  Administered 2020-02-06 (×3): 50 ug via INTRAVENOUS
  Administered 2020-02-06: 100 ug via INTRAVENOUS

## 2020-02-06 MED ORDER — PROPOFOL 10 MG/ML IV BOLUS
INTRAVENOUS | Status: DC | PRN
  Administered 2020-02-06: 240 mg via INTRAVENOUS

## 2020-02-06 MED ORDER — ONDANSETRON HCL 4 MG/2ML IJ SOLN
INTRAMUSCULAR | Status: AC
  Filled 2020-02-06: qty 2

## 2020-02-06 MED ORDER — CHLORHEXIDINE GLUCONATE 0.12 % MT SOLN
OROMUCOSAL | Status: AC
  Administered 2020-02-06: 15 mL
  Filled 2020-02-06: qty 15

## 2020-02-06 MED ORDER — OXYCODONE-ACETAMINOPHEN 5-325 MG PO TABS: 1.0000 | ORAL_TABLET | ORAL | 0 refills | Status: AC | PRN

## 2020-02-06 MED ORDER — DEXAMETHASONE SODIUM PHOSPHATE 10 MG/ML IJ SOLN
INTRAMUSCULAR | Status: DC | PRN
  Administered 2020-02-06: 5 mg via INTRAVENOUS

## 2020-02-06 MED ORDER — LIDOCAINE HCL 1 % IJ SOLN
INTRAMUSCULAR | Status: DC | PRN
  Administered 2020-02-06: 10 mL

## 2020-02-06 MED ORDER — KETOROLAC TROMETHAMINE 30 MG/ML IJ SOLN
INTRAMUSCULAR | Status: AC
  Filled 2020-02-06: qty 1

## 2020-02-06 MED ORDER — GLYCOPYRROLATE PF 0.2 MG/ML IJ SOSY
PREFILLED_SYRINGE | INTRAMUSCULAR | Status: AC
  Filled 2020-02-06: qty 1

## 2020-02-06 MED ORDER — SODIUM CHLORIDE 0.9 % IV SOLN
2.0000 g | INTRAVENOUS | Status: AC
  Administered 2020-02-06: 2 g via INTRAVENOUS
  Filled 2020-02-06: qty 2

## 2020-02-06 MED ORDER — IBUPROFEN 600 MG PO TABS: 600.0000 mg | ORAL_TABLET | Freq: Four times a day (QID) | ORAL | 0 refills | Status: AC | PRN

## 2020-02-06 MED ORDER — HYDROMORPHONE HCL 1 MG/ML IJ SOLN
INTRAMUSCULAR | Status: AC
  Filled 2020-02-06: qty 2

## 2020-02-06 MED ORDER — HYDROMORPHONE HCL 1 MG/ML IJ SOLN
0.2500 mg | INTRAMUSCULAR | Status: DC | PRN
  Administered 2020-02-06 (×2): 0.5 mg via INTRAVENOUS

## 2020-02-06 MED ORDER — MIDAZOLAM HCL 2 MG/2ML IJ SOLN
INTRAMUSCULAR | Status: DC | PRN
  Administered 2020-02-06: 2 mg via INTRAVENOUS

## 2020-02-06 MED ORDER — PROMETHAZINE HCL 25 MG/ML IJ SOLN
6.2500 mg | INTRAMUSCULAR | Status: DC | PRN
  Administered 2020-02-06: 12.5 mg via INTRAVENOUS

## 2020-02-06 MED ORDER — MEPERIDINE HCL 25 MG/ML IJ SOLN: 6.2500 mg | INTRAMUSCULAR | Status: DC | PRN

## 2020-02-06 MED ORDER — SODIUM CHLORIDE 0.9 % IR SOLN
Status: DC | PRN
  Administered 2020-02-06: 3000 mL

## 2020-02-06 MED ORDER — KETOROLAC TROMETHAMINE 30 MG/ML IJ SOLN
30.0000 mg | Freq: Once | INTRAMUSCULAR | Status: AC
  Administered 2020-02-06: 30 mg via INTRAVENOUS

## 2020-02-06 MED ORDER — ONDANSETRON HCL 4 MG/2ML IJ SOLN
INTRAMUSCULAR | Status: DC | PRN
  Administered 2020-02-06: 4 mg via INTRAVENOUS

## 2020-02-06 SURGICAL SUPPLY — 16 items
CANISTER SUCT 3000ML PPV (MISCELLANEOUS) ×2 IMPLANT
CATH ROBINSON RED A/P 16FR (CATHETERS) ×2 IMPLANT
DEVICE MYOSURE LITE (MISCELLANEOUS) ×2 IMPLANT
DEVICE MYOSURE REACH (MISCELLANEOUS) IMPLANT
GLOVE BIO SURGEON STRL SZ 6.5 (GLOVE) ×2 IMPLANT
GLOVE BIOGEL PI IND STRL 7.0 (GLOVE) ×2 IMPLANT
GLOVE BIOGEL PI INDICATOR 7.0 (GLOVE) ×2
GOWN STRL REUS W/ TWL LRG LVL3 (GOWN DISPOSABLE) ×2 IMPLANT
GOWN STRL REUS W/TWL LRG LVL3 (GOWN DISPOSABLE) ×2
KIT PROCEDURE FLUENT (KITS) ×2 IMPLANT
KIT TURNOVER KIT B (KITS) ×2 IMPLANT
PACK VAGINAL MINOR WOMEN LF (CUSTOM PROCEDURE TRAY) ×2 IMPLANT
PAD OB MATERNITY 4.3X12.25 (PERSONAL CARE ITEMS) ×2 IMPLANT
SEAL ROD LENS SCOPE MYOSURE (ABLATOR) ×2 IMPLANT
TOWEL GREEN STERILE FF (TOWEL DISPOSABLE) ×4 IMPLANT
UNDERPAD 30X36 HEAVY ABSORB (UNDERPADS AND DIAPERS) ×2 IMPLANT

## 2020-02-06 NOTE — Discharge Instructions (Signed)
FU office 2-3 weeks for postop appointment.  Call the office [phone] for an appointment. ° °Personal Hygiene: °Use pads not tampons x 1week °You may shower, no tub baths or pools for 2-3 weeks °Wipe from front to back when using restroom ° °Activity: °Do not drive or operate any equipment for 24 hrs.   °Do not rest in bed all day °Walking is encouraged °Walk up and down stairs slowly °You may return to your normal activity in 1-2 days ° °Sexual Activity:  No intercourse for 2 weeks after the procedure. ° °Diet: Eat a light meal as desired this evening.  You may resume your usual diet tomorrow. ° °Return to work:  You may resume your work activities after 1-2 days ° °What to expect:  Expect to have vaginal bleeding/discharge for 2-3 days and spotting for 10-14 days.  It is not unusual to have soreness for 1-2 weeks.  You may have a slight burning sensation when you urinate for the first few days.  You may start your menses in 2-6 weeks.  Mild cramps may continue for a couple of days.   ° °Call your doctor:   °Excessive bleeding, saturating a pad every hour °Inability to urinate 6 hours after discharge °Pain not relieved with pain medications °Fever of 100.4 or greater ° °

## 2020-02-06 NOTE — Transfer of Care (Signed)
Immediate Anesthesia Transfer of Care Note  Patient: Kathryn Reese  Procedure(s) Performed: [place], polypectomy  (N/A Uterus)  Patient Location: PACU  Anesthesia Type:General  Level of Consciousness: drowsy and patient cooperative  Airway & Oxygen Therapy: Patient Spontanous Breathing and Patient connected to nasal cannula oxygen  Post-op Assessment: Report given to RN and Post -op Vital signs reviewed and stable  Post vital signs: Reviewed and stable  Last Vitals:  Vitals Value Taken Time  BP 151/83 02/06/20 0835  Temp    Pulse 79 02/06/20 0837  Resp 13 02/06/20 0837  SpO2 95 % 02/06/20 0837  Vitals shown include unvalidated device data.  Last Pain:  Vitals:   02/06/20 0619  TempSrc: Oral  PainSc:          Complications: No complications documented.

## 2020-02-06 NOTE — Anesthesia Procedure Notes (Signed)
Procedure Name: LMA Insertion Date/Time: 02/06/2020 7:59 AM Performed by: Kathryne Hitch, CRNA Pre-anesthesia Checklist: Patient identified, Emergency Drugs available, Suction available and Patient being monitored Patient Re-evaluated:Patient Re-evaluated prior to induction Oxygen Delivery Method: Circle system utilized Preoxygenation: Pre-oxygenation with 100% oxygen Induction Type: IV induction Ventilation: Mask ventilation without difficulty LMA: LMA inserted LMA Size: 4.0 Number of attempts: 1 Placement Confirmation: positive ETCO2 and breath sounds checked- equal and bilateral Tube secured with: Tape Dental Injury: Teeth and Oropharynx as per pre-operative assessment

## 2020-02-06 NOTE — Op Note (Signed)
Pre op dx: PMB, endometrial polyp  Post op dx: same  Procedure: hysteroscopy, D&C, polypectomy, cervical block  Surgeon: Marylynn Pearson, MD  EBL: <50cc  Specimen: endometrial curettings  Anesthesia: general and local  Complications: none  Condition: stable  Pt was taken to the OR after informed consent.  Anesthesia was given and she was placed in dorsal lithotomy position.  Prepped and draped in sterile condition.  Bivalve speculum placed in vaginal and single tooth tenaculum placed on anterior lip of cervix.  Cervix was serially dilated with pratt dilators.  Diagnostic hysteroscope inserted and survey of intrauterine cavity performed.  polypoid tissue and fluffy endometrium noted.  Myosure was inserted into scope and used to remove polyps.  Hysteroscope removed and a gentle curetting performed.  Specimen placed on telfa and passed off to be sent to pathology.   Sponge, needle, and instrument counts correct.

## 2020-02-07 ENCOUNTER — Encounter (HOSPITAL_COMMUNITY): Payer: Self-pay | Admitting: Obstetrics and Gynecology

## 2020-02-10 NOTE — Anesthesia Postprocedure Evaluation (Signed)
Anesthesia Post Note  Patient: Kathryn Reese  Procedure(s) Performed: [place], polypectomy  (N/A Uterus)     Patient location during evaluation: PACU Anesthesia Type: General Level of consciousness: sedated and patient cooperative Pain management: pain level controlled Vital Signs Assessment: post-procedure vital signs reviewed and stable Respiratory status: spontaneous breathing Cardiovascular status: stable Anesthetic complications: no   No complications documented.  Last Vitals:  Vitals:   02/06/20 0955 02/06/20 1010  BP: 110/76 130/70  Pulse: 77 82  Resp: 15 12  Temp:  (!) 36.3 C  SpO2: 97% 93%    Last Pain:  Vitals:   02/06/20 1010  TempSrc:   PainSc: Asleep   Pain Goal:                   [name]

## 2020-02-11 ENCOUNTER — Telehealth: Payer: Self-pay | Admitting: *Deleted

## 2020-02-11 LAB — SURGICAL PATHOLOGY

## 2020-02-11 NOTE — Telephone Encounter (Signed)
Called the patient to schedule a new patient appt; patient stated "My boss took things into her on hands and got me in with [facility] tomorrow." Explained to call the office back if she needs an appt. Called Santiago Glad at [facility] for [place] and gave the message to her regarding the appt

## 2021-01-12 ENCOUNTER — Other Ambulatory Visit: Payer: Self-pay | Admitting: Obstetrics and Gynecology

## 2021-01-12 ENCOUNTER — Other Ambulatory Visit: Payer: Self-pay

## 2021-01-12 DIAGNOSIS — Z1231 Encounter for screening mammogram for malignant neoplasm of breast: Secondary | ICD-10-CM

## 2021-02-09 ENCOUNTER — Ambulatory Visit
Admission: RE | Admit: 2021-02-09 | Discharge: 2021-02-09 | Disposition: A | Discharge status: Home / Self Care | Payer: Managed Care, Other (non HMO) | Source: Ambulatory Visit | Attending: Obstetrics and Gynecology | Admitting: Obstetrics and Gynecology

## 2021-02-09 ENCOUNTER — Other Ambulatory Visit: Payer: Self-pay

## 2021-02-09 DIAGNOSIS — Z1231 Encounter for screening mammogram for malignant neoplasm of breast: Secondary | ICD-10-CM

## 2021-12-28 ENCOUNTER — Other Ambulatory Visit: Payer: Self-pay | Admitting: Family Medicine

## 2021-12-28 DIAGNOSIS — Z1231 Encounter for screening mammogram for malignant neoplasm of breast: Secondary | ICD-10-CM

## 2022-02-15 ENCOUNTER — Ambulatory Visit
Admission: RE | Admit: 2022-02-15 | Discharge: 2022-02-15 | Disposition: A | Discharge status: Home / Self Care | Payer: Managed Care, Other (non HMO) | Source: Ambulatory Visit | Attending: Family Medicine | Admitting: Family Medicine

## 2022-02-15 DIAGNOSIS — Z1231 Encounter for screening mammogram for malignant neoplasm of breast: Secondary | ICD-10-CM

## 2023-04-20 ENCOUNTER — Other Ambulatory Visit: Payer: Self-pay | Admitting: Family Medicine

## 2023-04-20 DIAGNOSIS — Z1231 Encounter for screening mammogram for malignant neoplasm of breast: Secondary | ICD-10-CM

## 2023-05-17 ENCOUNTER — Ambulatory Visit
Admission: RE | Admit: 2023-05-17 | Discharge: 2023-05-17 | Disposition: A | Discharge status: Home / Self Care | Payer: Managed Care, Other (non HMO) | Source: Ambulatory Visit | Attending: Family Medicine | Admitting: Family Medicine

## 2023-05-17 DIAGNOSIS — Z1231 Encounter for screening mammogram for malignant neoplasm of breast: Secondary | ICD-10-CM

## 2023-05-22 ENCOUNTER — Other Ambulatory Visit: Payer: Self-pay | Admitting: Family Medicine

## 2023-05-22 DIAGNOSIS — R928 Other abnormal and inconclusive findings on diagnostic imaging of breast: Secondary | ICD-10-CM

## 2023-06-15 ENCOUNTER — Ambulatory Visit
Admission: RE | Admit: 2023-06-15 | Discharge: 2023-06-15 | Disposition: A | Discharge status: Home / Self Care | Payer: Managed Care, Other (non HMO) | Source: Ambulatory Visit | Attending: Family Medicine | Admitting: Family Medicine

## 2023-06-15 DIAGNOSIS — R928 Other abnormal and inconclusive findings on diagnostic imaging of breast: Secondary | ICD-10-CM

## 2023-06-16 ENCOUNTER — Other Ambulatory Visit: Payer: Self-pay | Admitting: Family Medicine

## 2023-06-16 DIAGNOSIS — N6489 Other specified disorders of breast: Secondary | ICD-10-CM

## 2023-06-17 IMAGING — MG MM DIGITAL SCREENING BILAT W/ TOMO AND CAD
8 series · 8 of 24 positions shown · non-contrast
Comparison: Previous exam(s).

CLINICAL DATA: Screening.

EXAM:
DIGITAL SCREENING BILATERAL MAMMOGRAM WITH TOMOSYNTHESIS AND CAD
TECHNIQUE: Bilateral screening digital craniocaudal and mediolateral oblique
mammograms were obtained. Bilateral screening digital breast
tomosynthesis was performed. The images were evaluated with
computer-aided detection.

[R MLO synth-2D]
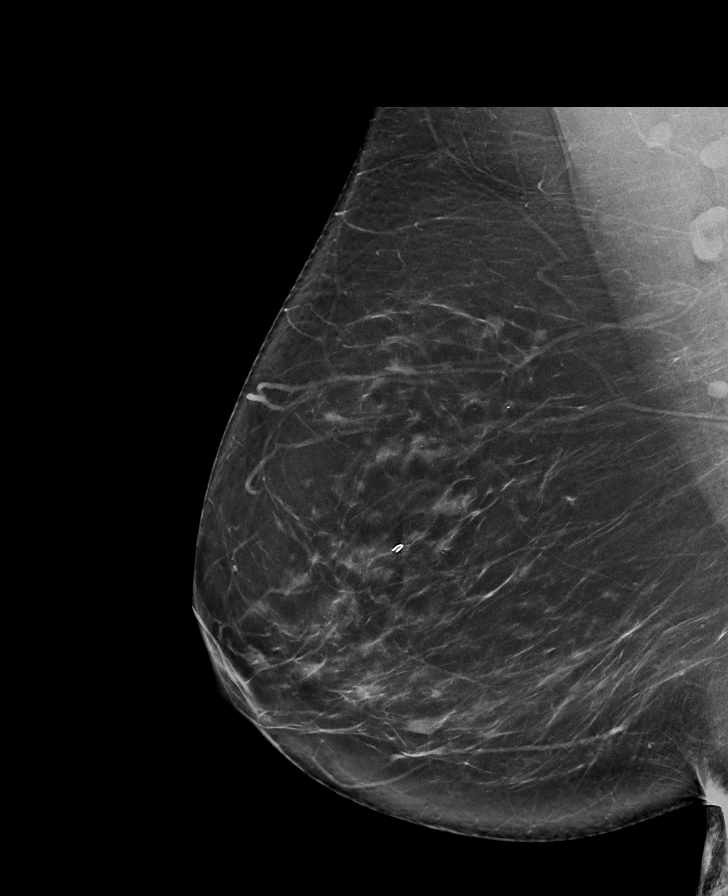

[L MLO synth-2D]
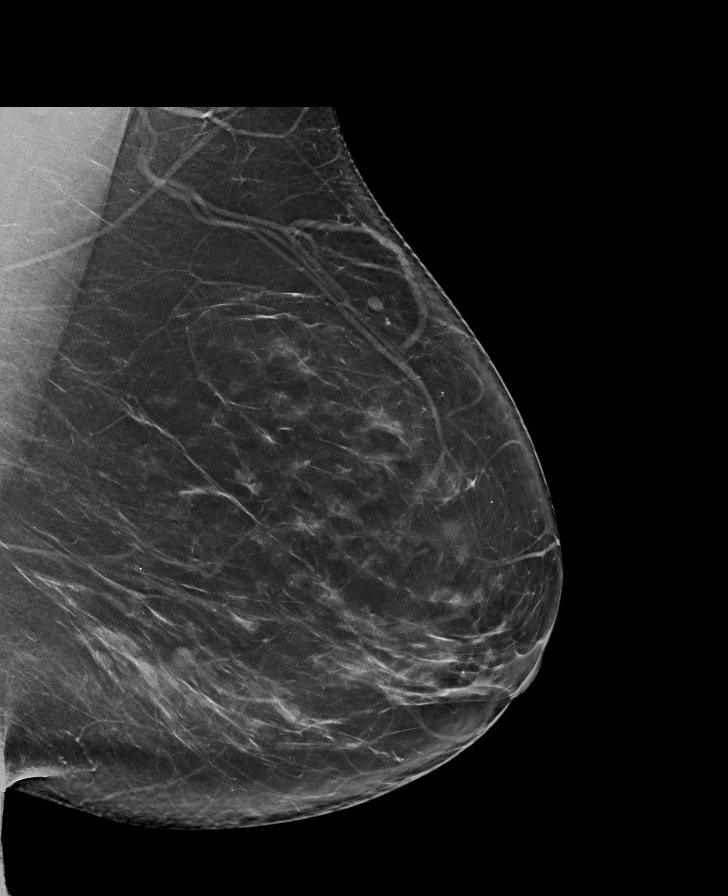

[L CC synth-2D]
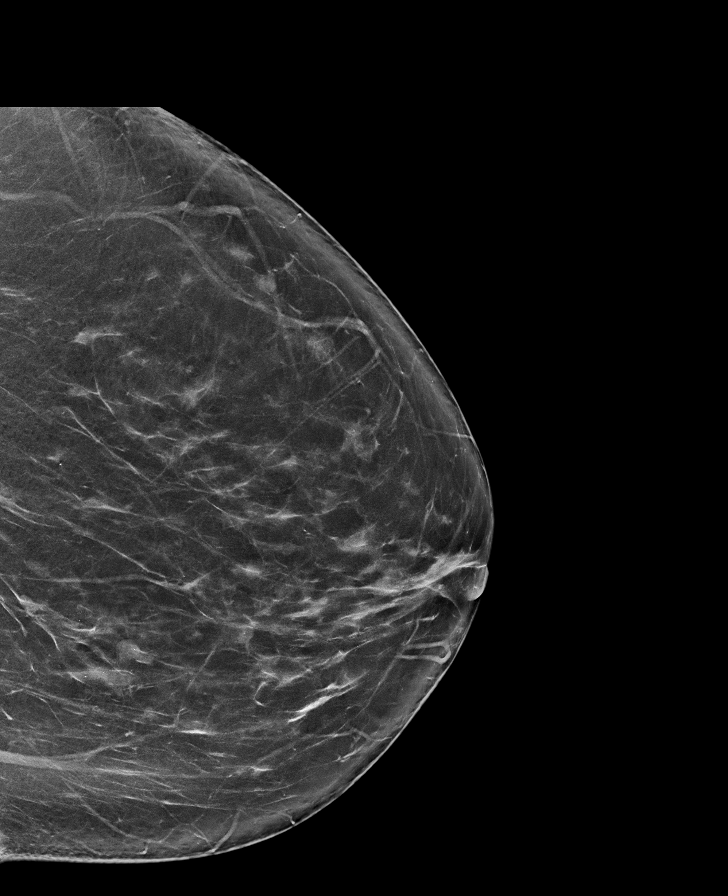

[R CC synth-2D]
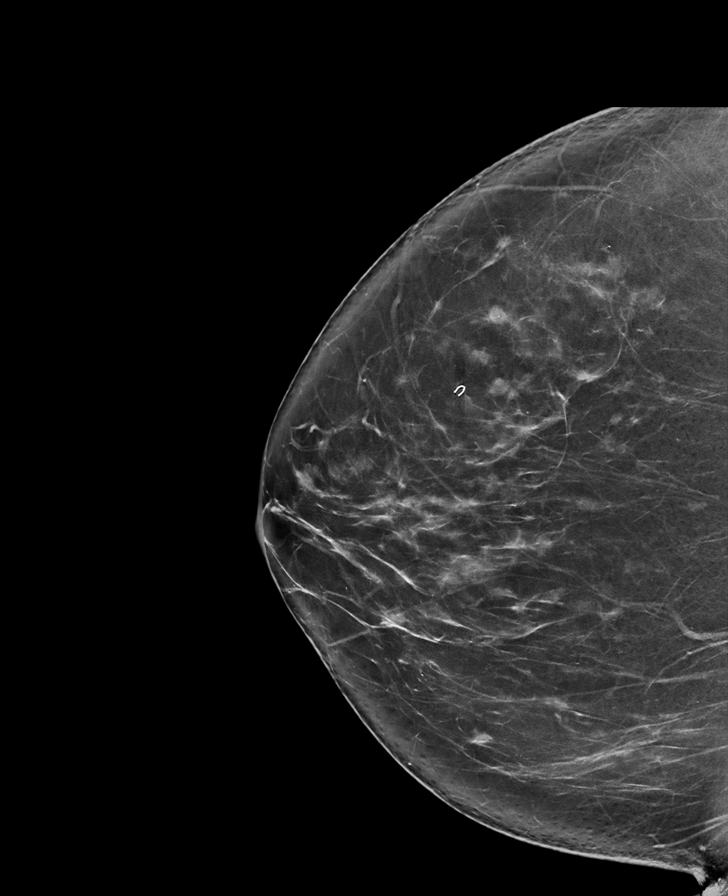

[L MLO tomo · tomo slice 46/91.0]
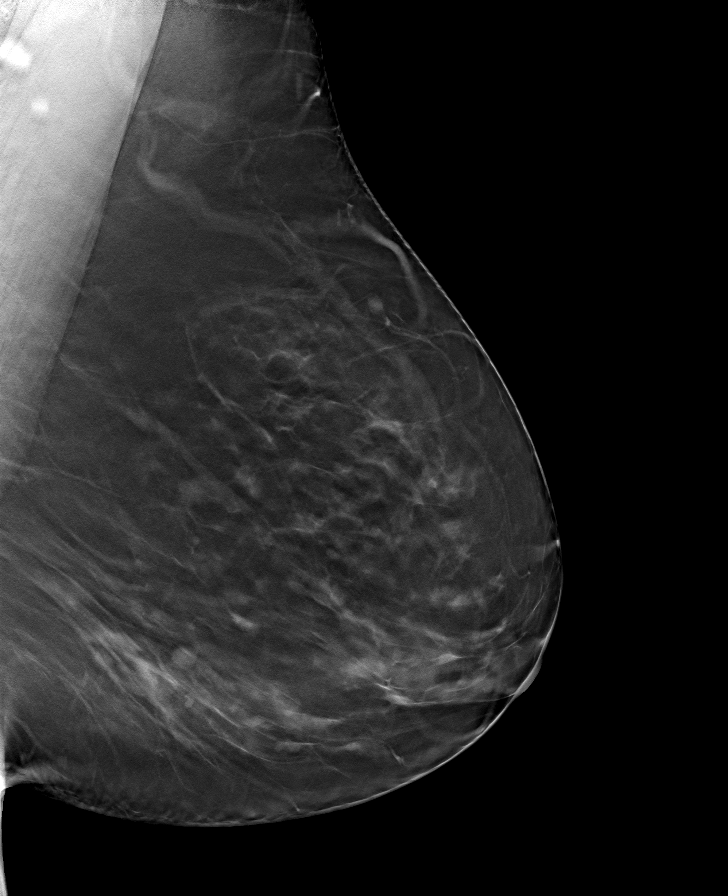

[R MLO tomo · tomo slice 47/93.0]
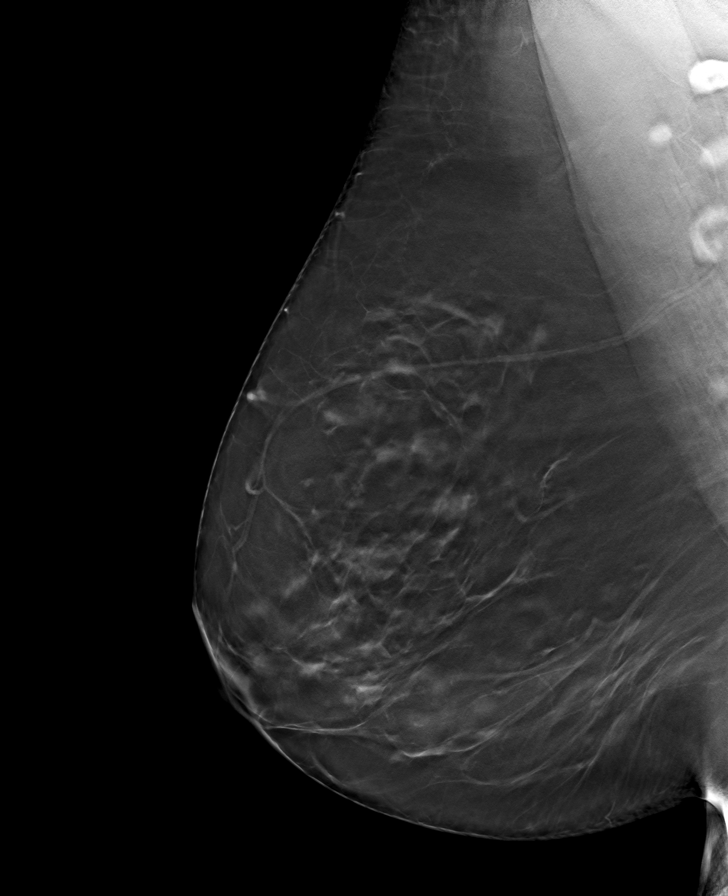

[L CC tomo · tomo slice 43/85.0]
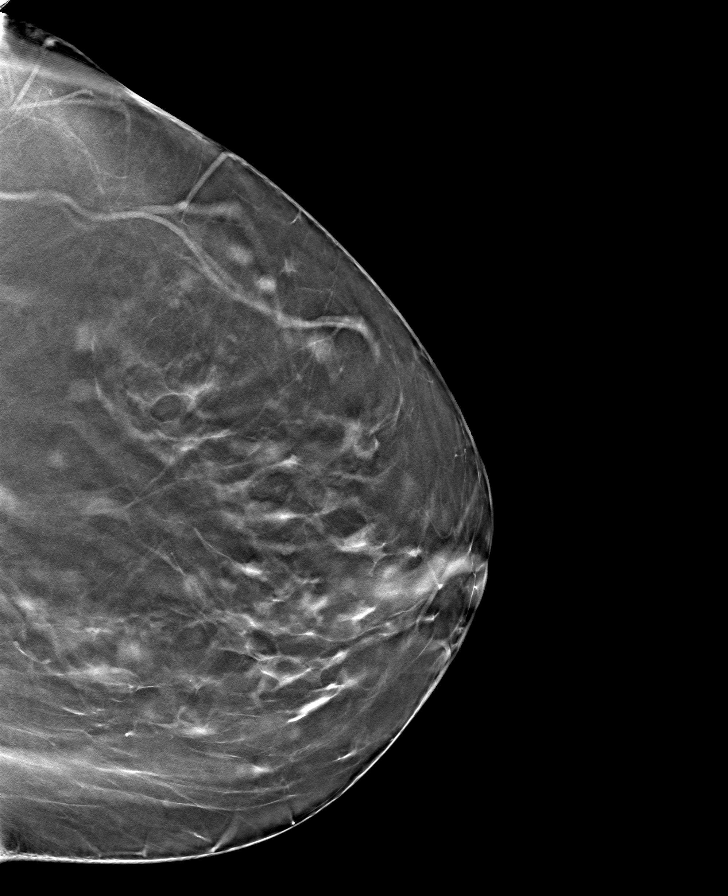

[R CC tomo · tomo slice 43/84.0]
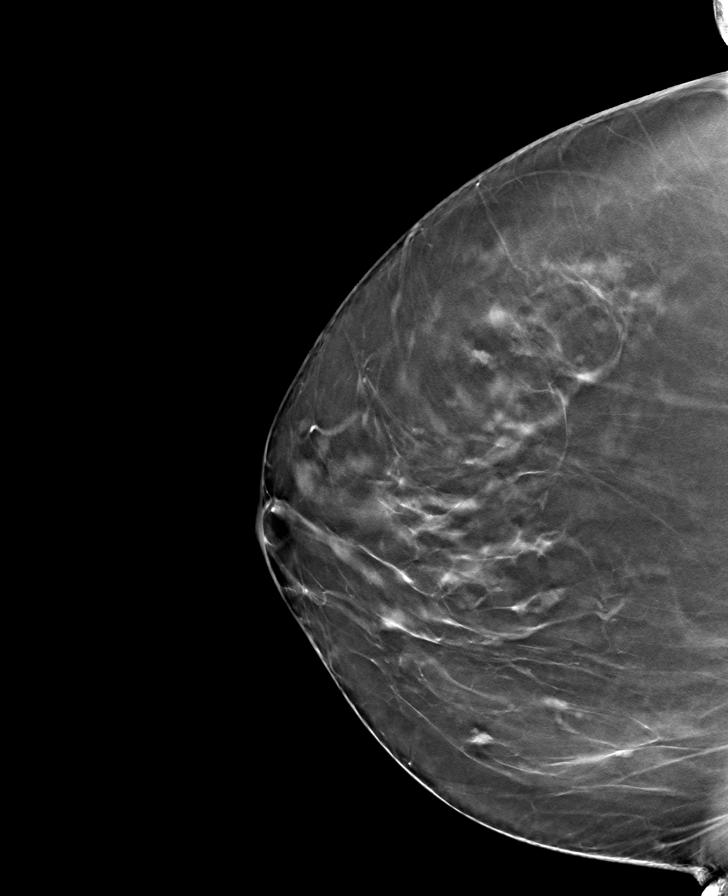

[8 of 24 positions shown; findings below may reference images not displayed]

ACR Breast Density Category b: There are scattered areas of
fibroglandular density.
FINDINGS: There are no findings suspicious for malignancy.
IMPRESSION: No mammographic evidence of malignancy. A result letter of this
screening mammogram will be mailed directly to the patient.

RECOMMENDATION:
Screening mammogram in one year. (Code:51-O-LD2)

BI-RADS CATEGORY  1: Negative.

## 2023-10-10 ENCOUNTER — Encounter: Payer: Self-pay | Admitting: Family Medicine

## 2023-12-18 ENCOUNTER — Ambulatory Visit
Admission: RE | Admit: 2023-12-18 | Discharge: 2023-12-18 | Disposition: A | Discharge status: Home / Self Care | Source: Ambulatory Visit | Attending: Family Medicine | Admitting: Family Medicine

## 2023-12-18 DIAGNOSIS — N6489 Other specified disorders of breast: Secondary | ICD-10-CM
